# Patient Record
Sex: Female | Born: 1955 | Race: White | Hispanic: No | Marital: Married
Health system: Southern US, Community
[De-identification: ages and names within clinical notes are randomized; demographics above are authoritative.]

## PROBLEM LIST (undated history)

## (undated) DIAGNOSIS — E079 Disorder of thyroid, unspecified: Secondary | ICD-10-CM

## (undated) DIAGNOSIS — J449 Chronic obstructive pulmonary disease, unspecified: Secondary | ICD-10-CM

## (undated) DIAGNOSIS — I38 Endocarditis, valve unspecified: Secondary | ICD-10-CM

---

## 2020-02-08 ENCOUNTER — Emergency Department (HOSPITAL_COMMUNITY): Payer: 59

## 2020-02-08 ENCOUNTER — Encounter (HOSPITAL_COMMUNITY): Payer: Self-pay

## 2020-02-08 ENCOUNTER — Other Ambulatory Visit: Payer: Self-pay

## 2020-02-08 ENCOUNTER — Emergency Department (HOSPITAL_COMMUNITY)
Admission: EM | Admit: 2020-02-08 | Discharge: 2020-02-09 | Disposition: A | Discharge status: Home / Self Care | Payer: 59 | Attending: Emergency Medicine | Admitting: Emergency Medicine

## 2020-02-08 DIAGNOSIS — R002 Palpitations: Secondary | ICD-10-CM | POA: Diagnosis present

## 2020-02-08 DIAGNOSIS — Z7901 Long term (current) use of anticoagulants: Secondary | ICD-10-CM | POA: Diagnosis not present

## 2020-02-08 DIAGNOSIS — J449 Chronic obstructive pulmonary disease, unspecified: Secondary | ICD-10-CM | POA: Diagnosis not present

## 2020-02-08 DIAGNOSIS — R0602 Shortness of breath: Secondary | ICD-10-CM | POA: Insufficient documentation

## 2020-02-08 DIAGNOSIS — I4892 Unspecified atrial flutter: Secondary | ICD-10-CM | POA: Diagnosis not present

## 2020-02-08 DIAGNOSIS — Z79899 Other long term (current) drug therapy: Secondary | ICD-10-CM | POA: Diagnosis not present

## 2020-02-08 HISTORY — DX: Disorder of thyroid, unspecified: E07.9

## 2020-02-08 HISTORY — DX: Chronic obstructive pulmonary disease, unspecified: J44.9

## 2020-02-08 HISTORY — DX: Endocarditis, valve unspecified: I38

## 2020-02-08 LAB — CBC
HCT: 40.3 % (ref 36.0–46.0)
Hemoglobin: 13.2 g/dL (ref 12.0–15.0)
MCH: 32.4 pg (ref 26.0–34.0)
MCHC: 32.8 g/dL (ref 30.0–36.0)
MCV: 98.8 fL (ref 80.0–100.0)
Platelets: 333 10*3/uL (ref 150–400)
RBC: 4.08 MIL/uL (ref 3.87–5.11)
RDW: 11.9 % (ref 11.5–15.5)
WBC: 8.3 10*3/uL (ref 4.0–10.5)
nRBC: 0 % (ref 0.0–0.2)

## 2020-02-08 NOTE — ED Triage Notes (Signed)
Pt BIB [facility] from home c/o of elevated heartrate. Pt has hx of murmer and leaky valves. Upon EMS arrival pt was found to be in a-fib with HR 130-150s. Pt was also c/o [facility] that has since resolved. Pt received 300 NS of fluid.

## 2020-02-08 NOTE — ED Provider Notes (Signed)
[facility] EMERGENCY DEPARTMENT Provider Note   CSN: [id number] Arrival date & time: 02/08/20  2233     History Chief Complaint  Patient presents with  . Atrial Fibrillation  . Shortness of Breath    Lauren Edwards is a 64 y.o. female.  HPI     This is a 64 year old female with a history of COPD, "leaky heart valve" and thyroid disease who presents with palpitations.  Patient reports that approximately 2-1/2 hours prior to arrival she had onset of heart palpitations and fluttering.  Denies any history of arrhythmias.  Denies chest pain.  She states she did have some shortness of breath earlier today.  She is from [place] and flew to [place] for [employer] .  Denies any lower extremity swelling or pain.  She is not on any anticoagulants.  Denies fevers or recent illnesses.  Past Medical History:  Diagnosis Date  . COPD (chronic obstructive pulmonary disease) (HCC)   . Leaky heart valve   . Thyroid disease     There are no problems to display for this patient.   History reviewed. No pertinent surgical history.   OB History   No obstetric history on file.     History reviewed. No pertinent family history.  Social History   Tobacco Use  . Smoking status: Not on file  Substance Use Topics  . Alcohol use: Not on file  . Drug use: Not on file    Home Medications Prior to Admission medications   Medication Sig Start Date End Date Taking? Authorizing Provider  acetaminophen (TYLENOL) 500 MG tablet Take 1,000 mg by mouth every 6 (six) hours as needed for mild pain.   Yes [provider]  ARMOUR THYROID 30 MG tablet Take 1 tablet by mouth daily. Take with a 60mg  tablet for a total dose of 90mg . 11/17/19  Yes [provider]  Ascorbic Acid (VITAMIN C PO) Take 2-3 tablets by mouth daily. 2000mg    Yes [provider]  CALCIUM PO Take 2 tablets by mouth daily.   Yes [provider]  Multiple Vitamin  (MULTIVITAMIN) LIQD Take 5-10 mLs by mouth daily.   Yes [provider]  NP THYROID 60 MG tablet Take 60 mg by mouth daily. 01/19/20  Yes [provider]  tetrahydrozoline 0.05 % ophthalmic solution Place 1-2 drops into both eyes daily as needed (Dry eyes).   Yes [provider]  apixaban (ELIQUIS) 5 MG TABS tablet Take 1 tablet (5 mg total) by mouth 2 (two) times daily. 02/09/20 03/10/20  Imogene Gravelle, 01/21/20, MD    Allergies    Patient has no known allergies.  Review of Systems   Review of Systems  Constitutional: Negative for fever.  Respiratory: Positive for shortness of breath. Negative for cough and wheezing.   Cardiovascular: Positive for palpitations. Negative for chest pain.  Gastrointestinal: Negative for abdominal pain, nausea and vomiting.  Genitourinary: Negative for dysuria.  Neurological: Negative for headaches.  All other systems reviewed and are negative.   Physical Exam Updated Vital Signs BP 122/75   Pulse 69   Temp 97.8 F (36.6 C) (Oral)   Resp 16   SpO2 94%   Physical Exam Vitals and nursing note reviewed.  Constitutional:      Appearance: She is well-developed. She is not ill-appearing.  HENT:     Head: Normocephalic and atraumatic.  Eyes:     Pupils: Pupils are equal, round, and reactive to light.  Cardiovascular:  Rate and Rhythm: Tachycardia present. Rhythm irregular.     Heart sounds: Normal heart sounds.  Pulmonary:     Effort: Pulmonary effort is normal. No respiratory distress.     Breath sounds: No wheezing.  Chest:     Chest wall: No tenderness.  Abdominal:     General: Bowel sounds are normal.     Palpations: Abdomen is soft.  Musculoskeletal:     Cervical back: Neck supple.     Right lower leg: No tenderness. No edema.     Left lower leg: No tenderness. No edema.  Skin:    General: Skin is warm and dry.  Neurological:     Mental Status: She is alert and oriented to person, place, and time.  Psychiatric:         Mood and Affect: Mood normal.     ED Results / Procedures / Treatments   Labs (all labs ordered are listed, but only abnormal results are displayed) Labs Reviewed  BASIC METABOLIC PANEL - Abnormal; Notable for the following components:      Result Value   Glucose, Bld 120 (*)    All other components within normal limits  TSH - Abnormal; Notable for the following components:   TSH 0.041 (*)    All other components within normal limits  TROPONIN I (HIGH SENSITIVITY) - Abnormal; Notable for the following components:   Troponin I (High Sensitivity) 21 (*)    All other components within normal limits  TROPONIN I (HIGH SENSITIVITY) - Abnormal; Notable for the following components:   Troponin I (High Sensitivity) 18 (*)    All other components within normal limits  MAGNESIUM  CBC  BRAIN NATRIURETIC PEPTIDE  D-DIMER, QUANTITATIVE (NOT AT [facility])  TROPONIN I (HIGH SENSITIVITY)  TROPONIN I (HIGH SENSITIVITY)    EKG EKG Interpretation  Date/Time:  Tuesday February 08 2020 23:00:12 EDT Ventricular Rate:  132 PR Interval:    QRS Duration: 89 QT Interval:  362 QTC Calculation: 537 R Axis:   64 Text Interpretation: Atrial flutter with varied AV block, Probable anterior infarct, age indeterminate Prolonged QT interval No prior for comparison Confirmed by Thayer Jew [id number]) on 02/08/2020 11:01:54 PM   EKG Interpretation  Date/Time:  Wednesday February 09 2020 00:31:22 EDT Ventricular Rate:  95 PR Interval:    QRS Duration: 86 QT Interval:  341 QTC Calculation: 429 R Axis:   74 Text Interpretation: Sinus rhythm Probable left atrial enlargement Baseline wander in lead(s) II Confirmed by Thayer Jew [phone]) on 02/09/2020 4:44:06 AM       Radiology DG Chest Port 1 View  Result Date: 02/08/2020 CLINICAL DATA:  Atrial fibrillation. EXAM: PORTABLE CHEST 1 VIEW COMPARISON:  None. FINDINGS: Heart is normal in size.The cardiomediastinal contours are normal. Minor streaky atelectasis  at the lung bases. Pulmonary vasculature is normal. No consolidation, pleural effusion, or pneumothorax. No acute osseous abnormalities are seen. IMPRESSION: Minor streaky atelectasis at the lung bases. Electronically Signed   By: Keith Rake M.D.   On: 02/08/2020 23:30    Procedures Procedures (including critical care time)  Medications Ordered in ED Medications  apixaban (ELIQUIS) tablet 5 mg (5 mg Oral Given 02/09/20 0329)  diltiazem (CARDIZEM) injection 10 mg (10 mg Intravenous Given 02/09/20 0059)    ED Course  I have reviewed the triage vital signs and the nursing notes.  Pertinent labs & imaging results that were available during my care of the patient were reviewed by me and considered in my medical decision  making (see chart for details).  Clinical Course as of Feb 09 619  Wed Feb 09, 2020  0209 Patient spontaneously converted to sinus rhythm after urinating.  Symptoms have resolved.  Initial troponin XV.  Second troponin slightly elevated at 21.  Likely related to demand.  Will get a third troponin to assess for stability.  If stable or downtrending, would have low suspicion for native coronary artery disease and more suspicion for demand.  Patient is agreeable to plan.   [CH]    Clinical Course User Index [CH] Solomon Skowronek, Mayer Masker, MD   MDM Rules/Calculators/A&P                      This patients CHA2DS2-VASc Score and unadjusted Ischemic Stroke Rate (% per year) is equal to 0.6 % stroke rate/year from a score of 1  Above score calculated as 1 point each if present [CHF, HTN, DM, Vascular=MI/PAD/Aortic Plaque, Age if 65-74, or Female] Above score calculated as 2 points each if present [Age > 75, or Stroke/TIA/TE]   Patient presents with palpitations.  Noted to be in atrial flutter, fibrillation with RVR.  Onset approximately 2 and half hours prior to arrival.  Patient is very reliable historian.  She is not from [place].  Reports no known history of coronary artery  disease or arrhythmia.  Does report "leaky valve."  She is overall nontoxic and her vital signs are otherwise reassuring.  EKG shows atrial flutter with RVR.  Patient was a candidate for cardioversion; however, she cardioverted spontaneously in the room with resolution of symptoms.  No significant metabolic derangements.  She does have a history of thyroid disease.  TSH and D-dimer were also sent.  D-dimer to screen for PE given recent travel.  D-dimer is negative.  TSH is low which may represent some hyperactivity.  This certainly could exacerbate symptoms.  Recommend follow-up with her primary doctor for adjustments in meds.  Additionally, no significant metabolic derangements.  Initial troponin XV.  This increased to 21.  Patient has not had any chest pain.  Feel that this is likely related to her tachycardia.  She is agreed to stay for third troponin to further evaluate.  Troponins downtrending to 18.  Patient remains asymptomatic.  Will discharge with Eliquis.  She has cardiology follow-up in [place].  After history, exam, and medical workup I feel the patient has been appropriately medically screened and is safe for discharge home. Pertinent diagnoses were discussed with the patient. Patient was given return precautions.  Final Clinical Impression(s) / ED Diagnoses Final diagnoses:  Atrial flutter, unspecified type (HCC)    Rx / DC Orders ED Discharge Orders         Ordered    apixaban (ELIQUIS) 5 MG TABS tablet  2 times daily     02/09/20 0439    Amb referral to [facility]     02/08/20 2306           Phenix Grein, Mayer Masker, MD 02/09/20 [phone]

## 2020-02-09 LAB — BASIC METABOLIC PANEL
Anion gap: 7 (ref 5–15)
BUN: 16 mg/dL (ref 8–23)
CO2: 23 mmol/L (ref 22–32)
Calcium: 9.2 mg/dL (ref 8.9–10.3)
Chloride: 111 mmol/L (ref 98–111)
Creatinine, Ser: 0.62 mg/dL (ref 0.44–1.00)
GFR calc Af Amer: >60 mL/min (ref >60)
GFR calc non Af Amer: >60 mL/min (ref >60)
Glucose, Bld: 120 mg/dL — ABNORMAL HIGH (ref 70–99)
Potassium: 4.3 mmol/L (ref 3.5–5.1)
Sodium: 141 mmol/L (ref 135–145)

## 2020-02-09 LAB — D-DIMER, QUANTITATIVE: D-Dimer, Quant: 0.33 ug/mL-FEU (ref 0.00–0.50)

## 2020-02-09 LAB — MAGNESIUM: Magnesium: 1.8 mg/dL (ref 1.7–2.4)

## 2020-02-09 LAB — TROPONIN I (HIGH SENSITIVITY)
Troponin I (High Sensitivity): 15 ng/L (ref <18)
Troponin I (High Sensitivity): 21 ng/L — ABNORMAL HIGH (ref <18)
Troponin I (High Sensitivity): 18 ng/L — ABNORMAL HIGH (ref <18)

## 2020-02-09 LAB — BRAIN NATRIURETIC PEPTIDE: B Natriuretic Peptide: 65.8 pg/mL (ref 0.0–100.0)

## 2020-02-09 LAB — TSH: TSH: 0.041 u[IU]/mL — ABNORMAL LOW (ref 0.350–4.500)

## 2020-02-09 MED ORDER — APIXABAN 5 MG PO TABS
5.0000 mg | ORAL_TABLET | Freq: Two times a day (BID) | ORAL | 0 refills | Status: AC
Start: 2020-02-09 — End: 2020-03-10

## 2020-02-09 MED ORDER — DILTIAZEM HCL 25 MG/5ML IV SOLN
10.0000 mg | Freq: Once | INTRAVENOUS | Status: AC
  Administered 2020-02-09: 10 mg via INTRAVENOUS
  Filled 2020-02-09: qty 5

## 2020-02-09 MED ORDER — APIXABAN 5 MG PO TABS
5.0000 mg | ORAL_TABLET | Freq: Two times a day (BID) | ORAL | Status: DC
  Administered 2020-02-09: 5 mg via ORAL
  Filled 2020-02-09 (×3): qty 1

## 2020-02-09 NOTE — ED Notes (Signed)
Discharge instructions discussed with pt. Pt verbalized understanding. Pt stable and ambulatory. No signature pad available. 

## 2020-02-09 NOTE — Discharge Instructions (Addendum)
You were seen and evaluated today for high heart rate.  Your work-up and EKG was notable for atrial flutter.  You converted to sinus rhythm spontaneously in the emergency room.  He will be started on Eliquis and need to follow-up closely with your cardiologist in [place] regarding ongoing anticoagulation and further work-up.  If you have any new or worsening symptoms while here in [place], you should be reevaluated immediately.

## 2020-12-27 IMAGING — DX DG CHEST 1V PORT
1 series · 1 of 1 positions shown · non-contrast
Comparison: None.

CLINICAL DATA: Atrial fibrillation.

EXAM:
PORTABLE CHEST 1 VIEW

[chest]
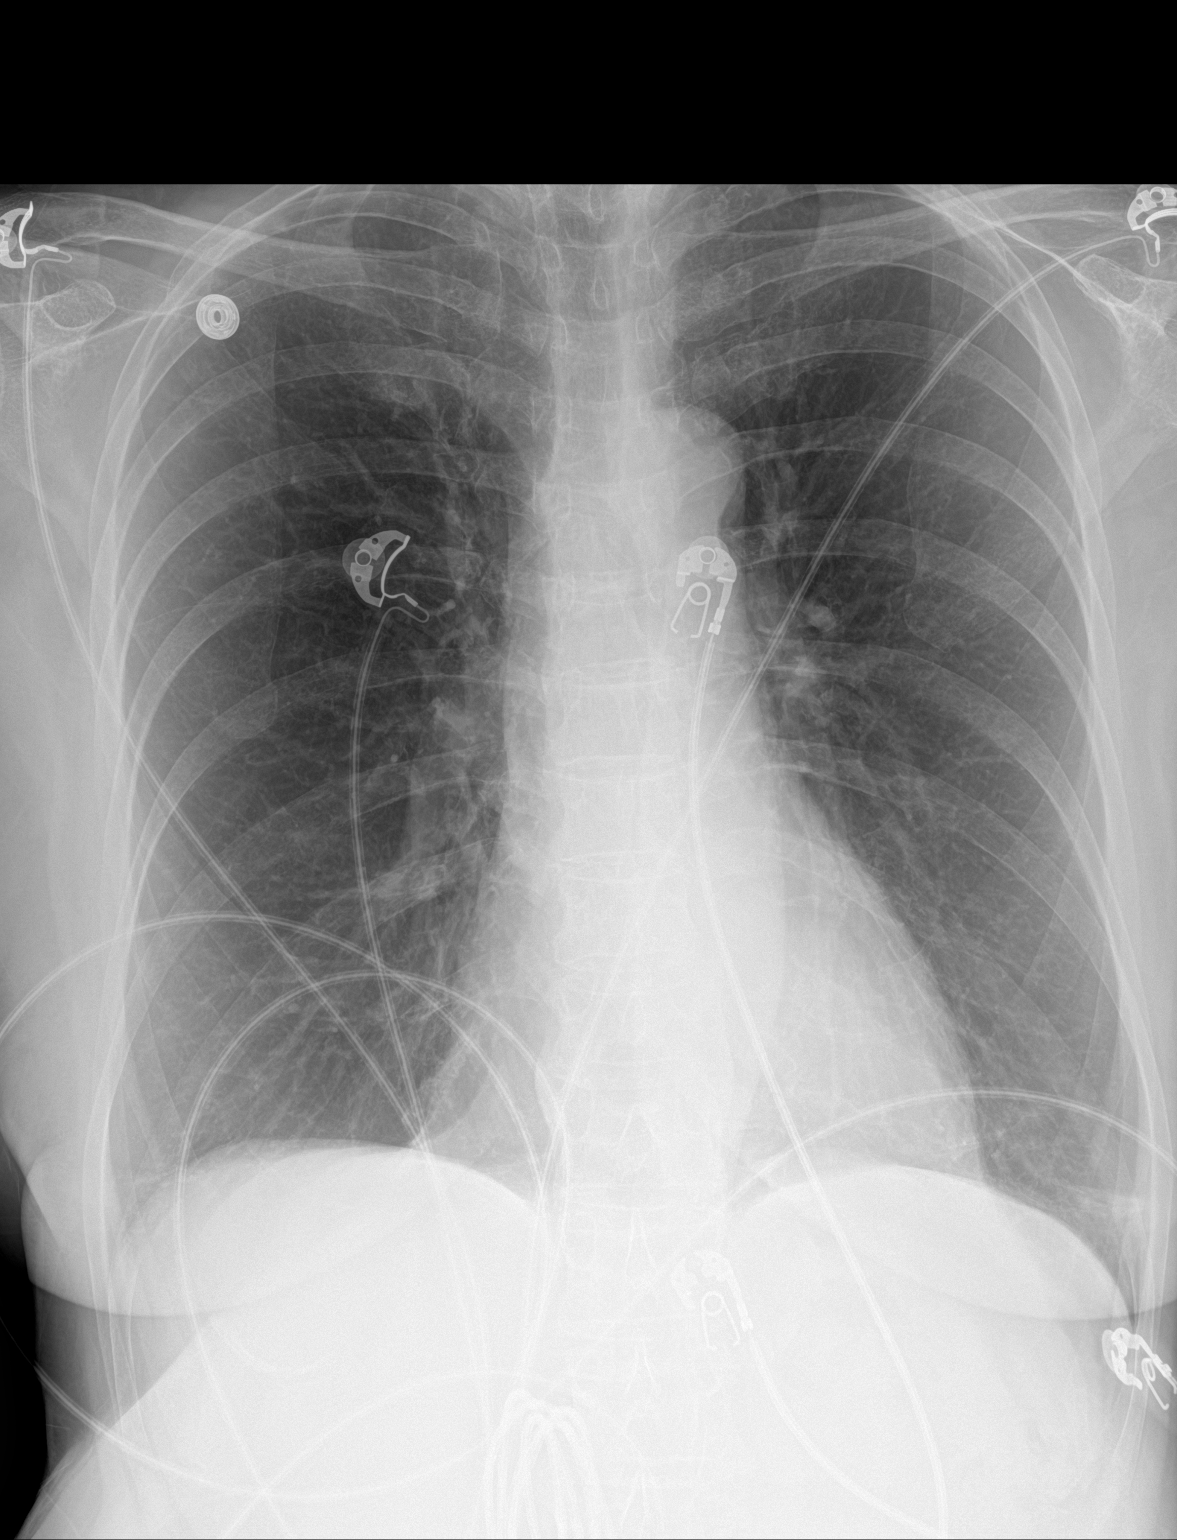

[1 of 1 positions shown; findings below may reference images not displayed]

FINDINGS: Heart is normal in size.The cardiomediastinal contours are normal.
Minor streaky atelectasis at the lung bases. Pulmonary vasculature
is normal. No consolidation, pleural effusion, or pneumothorax. No
acute osseous abnormalities are seen.
IMPRESSION: Minor streaky atelectasis at the lung bases.
